# Patient Record
Sex: Female | Born: 1965 | Race: White | Hispanic: No | Marital: Married | Smoking: Never smoker
Health system: Southern US, Community
[De-identification: ages and names within clinical notes are randomized; demographics above are authoritative.]

## PROBLEM LIST (undated history)

## (undated) DIAGNOSIS — R112 Nausea with vomiting, unspecified: Secondary | ICD-10-CM

## (undated) DIAGNOSIS — G43909 Migraine, unspecified, not intractable, without status migrainosus: Secondary | ICD-10-CM

## (undated) DIAGNOSIS — F419 Anxiety disorder, unspecified: Secondary | ICD-10-CM

## (undated) DIAGNOSIS — Z9889 Other specified postprocedural states: Secondary | ICD-10-CM

## (undated) DIAGNOSIS — E119 Type 2 diabetes mellitus without complications: Secondary | ICD-10-CM

## (undated) DIAGNOSIS — I10 Essential (primary) hypertension: Secondary | ICD-10-CM

## (undated) HISTORY — PX: CHOLECYSTECTOMY: SHX55

## (undated) HISTORY — PX: DILATION AND CURETTAGE OF UTERUS: SHX78

---

## 2001-07-25 ENCOUNTER — Ambulatory Visit (HOSPITAL_COMMUNITY): Admission: RE | Admit: 2001-07-25 | Discharge: 2001-07-25 | Payer: Self-pay | Admitting: Obstetrics & Gynecology

## 2001-07-25 ENCOUNTER — Encounter: Payer: Self-pay | Admitting: Obstetrics & Gynecology

## 2001-10-16 ENCOUNTER — Encounter: Payer: Self-pay | Admitting: Obstetrics & Gynecology

## 2001-10-16 ENCOUNTER — Ambulatory Visit (HOSPITAL_COMMUNITY): Admission: RE | Admit: 2001-10-16 | Discharge: 2001-10-16 | Payer: Self-pay | Admitting: Obstetrics & Gynecology

## 2002-01-02 ENCOUNTER — Encounter: Admission: RE | Admit: 2002-01-02 | Discharge: 2002-01-02 | Payer: Self-pay | Admitting: Obstetrics and Gynecology

## 2002-03-07 ENCOUNTER — Ambulatory Visit (HOSPITAL_COMMUNITY): Admission: RE | Admit: 2002-03-07 | Discharge: 2002-03-07 | Payer: Self-pay | Admitting: Obstetrics & Gynecology

## 2002-03-07 ENCOUNTER — Encounter: Payer: Self-pay | Admitting: Obstetrics & Gynecology

## 2002-03-08 ENCOUNTER — Inpatient Hospital Stay (HOSPITAL_COMMUNITY): Admission: AD | Admit: 2002-03-08 | Discharge: 2002-03-11 | Payer: Self-pay | Admitting: Obstetrics & Gynecology

## 2002-03-15 ENCOUNTER — Inpatient Hospital Stay (HOSPITAL_COMMUNITY): Admission: AD | Admit: 2002-03-15 | Discharge: 2002-03-15 | Payer: Self-pay | Admitting: Obstetrics & Gynecology

## 2002-04-03 ENCOUNTER — Other Ambulatory Visit: Admission: RE | Admit: 2002-04-03 | Discharge: 2002-04-03 | Payer: Self-pay | Admitting: Obstetrics & Gynecology

## 2002-10-25 HISTORY — PX: TUBAL LIGATION: SHX77

## 2003-05-28 ENCOUNTER — Other Ambulatory Visit: Admission: RE | Admit: 2003-05-28 | Discharge: 2003-05-28 | Payer: Self-pay | Admitting: Obstetrics & Gynecology

## 2004-03-09 ENCOUNTER — Emergency Department (HOSPITAL_COMMUNITY): Admission: EM | Admit: 2004-03-09 | Discharge: 2004-03-10 | Payer: Self-pay | Admitting: Emergency Medicine

## 2004-04-16 ENCOUNTER — Observation Stay (HOSPITAL_COMMUNITY): Admission: RE | Admit: 2004-04-16 | Discharge: 2004-04-17 | Payer: Self-pay | Admitting: General Surgery

## 2004-07-07 ENCOUNTER — Other Ambulatory Visit: Admission: RE | Admit: 2004-07-07 | Discharge: 2004-07-07 | Payer: Self-pay | Admitting: Obstetrics and Gynecology

## 2006-02-25 ENCOUNTER — Emergency Department (HOSPITAL_COMMUNITY): Admission: EM | Admit: 2006-02-25 | Discharge: 2006-02-25 | Payer: Self-pay | Admitting: *Deleted

## 2006-03-04 ENCOUNTER — Ambulatory Visit: Payer: Self-pay | Admitting: Cardiology

## 2006-03-15 ENCOUNTER — Ambulatory Visit: Payer: Self-pay

## 2011-06-08 ENCOUNTER — Encounter (HOSPITAL_COMMUNITY): Payer: Self-pay | Admitting: *Deleted

## 2011-06-08 NOTE — H&P (Signed)
  45 year old white female who presents with very heavy vaginal bleeding with passage of large blood clots and irregular vaginal bleeding. Initially seen on May 26, 2011 for this problem She was treated with Lysteda 650 mg 2 TID as well as Orthotricyclen birth control pills in an effort to control the heavy flow. This did decrease the bleeding but she presented again on June 08, 2011 with a renewed onset of the heavy flow. Due to the poor response to medical therapy she is being taken to the operating room to undergo D&C hysteroscopy and endometrial ablation.  Allergic history is positive for allergy to Sulfa antibiotics.  ROS:  Respiratory: no SOB or cough GI: no nausea, vomiting or diarrhea. GU: no dysuria, frequency or urgency Gyn: positive for irregular cycles, no discharge or itch  Physical Exam:  Afebrile,  BP  126/80 Pulse 84 Resp 16  HEENT: PERRLA, normocephalic Heart:  Regular rhythm Abdomen: soft, non tender, no enlargement of the liver or kidneys or spleen Pelvic:  Normal external genitalia              BUS: wnl              Vagina: without lesion but many blood clots present   Cervix: without lesion, non tender blood per os               Uterus: normal size, anterior, non tender               Adnexa: without mass non tender  Impression:   Menometrorrhagia without responce to medical Rx               Plan: D&C hysteroscopy, endometrial ablation.            Risk and benefits have been have been discussed.   Miguel Aschoff MD 06/08/2011

## 2011-06-09 ENCOUNTER — Encounter (HOSPITAL_COMMUNITY): Payer: Self-pay | Admitting: Anesthesiology

## 2011-06-09 ENCOUNTER — Ambulatory Visit (HOSPITAL_COMMUNITY): Payer: BC Managed Care – PPO | Admitting: Anesthesiology

## 2011-06-09 ENCOUNTER — Encounter (HOSPITAL_COMMUNITY)
Admission: RE | Disposition: A | Discharge status: Home / Self Care | Payer: Self-pay | Source: Ambulatory Visit | Attending: Obstetrics and Gynecology

## 2011-06-09 ENCOUNTER — Ambulatory Visit (HOSPITAL_COMMUNITY)
Admission: RE | Admit: 2011-06-09 | Discharge: 2011-06-09 | Disposition: A | Discharge status: Home / Self Care | Payer: BC Managed Care – PPO | Source: Ambulatory Visit | Attending: Obstetrics and Gynecology | Admitting: Obstetrics and Gynecology

## 2011-06-09 ENCOUNTER — Other Ambulatory Visit: Payer: Self-pay | Admitting: Obstetrics and Gynecology

## 2011-06-09 DIAGNOSIS — N921 Excessive and frequent menstruation with irregular cycle: Secondary | ICD-10-CM

## 2011-06-09 DIAGNOSIS — N920 Excessive and frequent menstruation with regular cycle: Secondary | ICD-10-CM | POA: Insufficient documentation

## 2011-06-09 DIAGNOSIS — N926 Irregular menstruation, unspecified: Secondary | ICD-10-CM | POA: Insufficient documentation

## 2011-06-09 HISTORY — DX: Anxiety disorder, unspecified: F41.9

## 2011-06-09 HISTORY — DX: Other specified postprocedural states: Z98.890

## 2011-06-09 HISTORY — DX: Nausea with vomiting, unspecified: R11.2

## 2011-06-09 LAB — BASIC METABOLIC PANEL
CO2: 26 mEq/L (ref 19–32)
Chloride: 102 mEq/L (ref 96–112)
Sodium: 138 mEq/L (ref 135–145)

## 2011-06-09 LAB — CBC
Platelets: 218 10*3/uL (ref 150–400)
RBC: 4.54 MIL/uL (ref 3.87–5.11)
WBC: 8.8 10*3/uL (ref 4.0–10.5)

## 2011-06-09 LAB — PROTIME-INR
INR: 0.99 (ref 0.00–1.49)
Prothrombin Time: 13.3 seconds (ref 11.6–15.2)

## 2011-06-09 LAB — PREGNANCY, URINE: Preg Test, Ur: NEGATIVE

## 2011-06-09 SURGERY — DILATATION & CURETTAGE/HYSTEROSCOPY WITH NOVASURE ABLATION
Anesthesia: Monitor Anesthesia Care | Site: Vagina | Wound class: Clean Contaminated

## 2011-06-09 MED ORDER — PROPOFOL 10 MG/ML IV EMUL
INTRAVENOUS | Status: DC | PRN
  Administered 2011-06-09: 200 mg via INTRAVENOUS

## 2011-06-09 MED ORDER — ONDANSETRON HCL 4 MG/2ML IJ SOLN
INTRAMUSCULAR | Status: AC
  Filled 2011-06-09: qty 2

## 2011-06-09 MED ORDER — KETOROLAC TROMETHAMINE 30 MG/ML IJ SOLN
INTRAMUSCULAR | Status: DC | PRN
  Administered 2011-06-09: 30 mg via INTRAVENOUS

## 2011-06-09 MED ORDER — PROPOFOL 10 MG/ML IV EMUL
INTRAVENOUS | Status: AC
  Filled 2011-06-09: qty 20

## 2011-06-09 MED ORDER — METOCLOPRAMIDE HCL 5 MG/ML IJ SOLN
INTRAMUSCULAR | Status: AC
  Administered 2011-06-09: 10 mg via INTRAVENOUS
  Filled 2011-06-09: qty 2

## 2011-06-09 MED ORDER — MIDAZOLAM HCL 2 MG/2ML IJ SOLN
INTRAMUSCULAR | Status: AC
  Filled 2011-06-09: qty 2

## 2011-06-09 MED ORDER — LIDOCAINE HCL (CARDIAC) 20 MG/ML IV SOLN
INTRAVENOUS | Status: AC
  Filled 2011-06-09: qty 5

## 2011-06-09 MED ORDER — MIDAZOLAM HCL 5 MG/5ML IJ SOLN
INTRAMUSCULAR | Status: DC | PRN
  Administered 2011-06-09: 2 mg via INTRAVENOUS

## 2011-06-09 MED ORDER — LACTATED RINGERS IR SOLN
Status: DC | PRN
  Administered 2011-06-09: 3000 mL

## 2011-06-09 MED ORDER — FENTANYL CITRATE 0.05 MG/ML IJ SOLN
25.0000 ug | INTRAMUSCULAR | Status: DC | PRN
  Administered 2011-06-09: 50 ug via INTRAVENOUS

## 2011-06-09 MED ORDER — FENTANYL CITRATE 0.05 MG/ML IJ SOLN
INTRAMUSCULAR | Status: AC
  Administered 2011-06-09: 50 ug via INTRAVENOUS
  Filled 2011-06-09: qty 2

## 2011-06-09 MED ORDER — KETOROLAC TROMETHAMINE 30 MG/ML IJ SOLN
INTRAMUSCULAR | Status: AC
  Filled 2011-06-09: qty 1

## 2011-06-09 MED ORDER — METOCLOPRAMIDE HCL 5 MG/ML IJ SOLN
10.0000 mg | Freq: Once | INTRAMUSCULAR | Status: AC
  Administered 2011-06-09: 10 mg via INTRAVENOUS

## 2011-06-09 MED ORDER — FENTANYL CITRATE 0.05 MG/ML IJ SOLN
INTRAMUSCULAR | Status: DC | PRN
  Administered 2011-06-09: 100 ug via INTRAVENOUS

## 2011-06-09 MED ORDER — CEFAZOLIN SODIUM 1-5 GM-% IV SOLN
INTRAVENOUS | Status: AC
  Administered 2011-06-09: 1 g via INTRAVENOUS
  Filled 2011-06-09: qty 50

## 2011-06-09 MED ORDER — CEFAZOLIN SODIUM 1-5 GM-% IV SOLN: 1.0000 g | INTRAVENOUS | Status: DC

## 2011-06-09 MED ORDER — ONDANSETRON HCL 4 MG/2ML IJ SOLN
INTRAMUSCULAR | Status: DC | PRN
  Administered 2011-06-09: 4 mg via INTRAVENOUS

## 2011-06-09 MED ORDER — LIDOCAINE HCL 1 % IJ SOLN
INTRAMUSCULAR | Status: DC | PRN
  Administered 2011-06-09: 10 mL via INTRADERMAL

## 2011-06-09 MED ORDER — LACTATED RINGERS IV SOLN
INTRAVENOUS | Status: DC
  Administered 2011-06-09 (×2): via INTRAVENOUS

## 2011-06-09 MED ORDER — LIDOCAINE HCL (CARDIAC) 20 MG/ML IV SOLN
INTRAVENOUS | Status: DC | PRN
  Administered 2011-06-09: 60 mg via INTRAVENOUS

## 2011-06-09 MED ORDER — FENTANYL CITRATE 0.05 MG/ML IJ SOLN
INTRAMUSCULAR | Status: AC
  Filled 2011-06-09: qty 2

## 2011-06-09 SURGICAL SUPPLY — 13 items
ABLATOR ENDOMETRIAL BIPOLAR (ABLATOR) ×2 IMPLANT
CATH ROBINSON RED A/P 16FR (CATHETERS) ×2 IMPLANT
CLOTH BEACON ORANGE TIMEOUT ST (SAFETY) ×2 IMPLANT
CONTAINER PREFILL 10% NBF 60ML (FORM) ×4 IMPLANT
GLOVE BIO SURGEON STRL SZ7.5 (GLOVE) ×4 IMPLANT
GOWN PREVENTION PLUS LG XLONG (DISPOSABLE) ×2 IMPLANT
GOWN PREVENTION PLUS XLARGE (GOWN DISPOSABLE) ×2 IMPLANT
NDL SPNL 22GX3.5 QUINCKE BK (NEEDLE) ×1 IMPLANT
NEEDLE SPNL 22GX3.5 QUINCKE BK (NEEDLE) ×2 IMPLANT
PACK HYSTEROSCOPY LF (CUSTOM PROCEDURE TRAY) ×2 IMPLANT
PAD PREP 24X48 CUFFED NSTRL (MISCELLANEOUS) ×2 IMPLANT
TOWEL OR 17X24 6PK STRL BLUE (TOWEL DISPOSABLE) ×4 IMPLANT
WATER STERILE IRR 1000ML POUR (IV SOLUTION) ×2 IMPLANT

## 2011-06-09 NOTE — Anesthesia Preprocedure Evaluation (Addendum)
Anesthesia Evaluation  Name, MR# and DOB Patient awake  General Assessment Comment  Reviewed: Allergy & Precautions, H&P , NPO status , Patient's Chart, lab work & pertinent test results  History of Anesthesia Complications (+) PONV  Airway Mallampati: III TM Distance: >3 FB Neck ROM: Full    Dental No notable dental hx. (+) Teeth Intact   Pulmonary  clear to auscultation  pulmonary exam normalPulmonary Exam Normal breath sounds clear to auscultation none    Cardiovascular Regular Normal    Neuro/Psych Negative Neurological ROS    GI/Hepatic/Renal negative GI ROS, negative Liver ROS, and negative Renal ROS (+)       Endo/Other  Negative Endocrine ROS (+)   Morbid obesity  Abdominal Normal abdominal exam  (+)   Musculoskeletal negative musculoskeletal ROS (+)   Hematology negative hematology ROS (+)   Peds  Reproductive/Obstetrics negative OB ROS    Anesthesia Other Findings            Anesthesia Physical Anesthesia Plan  ASA: III  Anesthesia Plan: MAC and General   Post-op Pain Management:    Induction: Intravenous  Airway Management Planned: LMA  Additional Equipment:   Intra-op Plan:   Post-operative Plan:   Informed Consent: I have reviewed the patients History and Physical, chart, labs and discussed the procedure including the risks, benefits and alternatives for the proposed anesthesia with the patient or authorized representative who has indicated his/her understanding and acceptance.   Dental Advisory Given  Plan Discussed with: Anesthesiologist  Anesthesia Plan Comments:       Anesthesia Quick Evaluation

## 2011-06-09 NOTE — Anesthesia Postprocedure Evaluation (Signed)
  Anesthesia Post-op Note  Patient: Christine Hood  Procedure(s) Performed:  DILATATION & CURETTAGE/HYSTEROSCOPY WITH NOVASURE ABLATION  Patient Location: PACU  Anesthesia Type: General  Level of Consciousness: awake, alert  and oriented  Airway and Oxygen Therapy: Patient Spontanous Breathing  Post-op Pain: mild  Post-op Assessment: Post-op Vital signs reviewed, Patient's Cardiovascular Status Stable, Respiratory Function Stable, Patent Airway, No signs of Nausea or vomiting and Pain level controlled  Post-op Vital Signs: Reviewed and stable  Complications: No apparent anesthesia complications

## 2011-06-09 NOTE — Brief Op Note (Signed)
06/09/2011  11:52 AM  PATIENT:  Christine Hood  45 y.o. female  PRE-OPERATIVE DIAGNOSIS:  menorrhagia  POST-OPERATIVE DIAGNOSIS:  menorrhagia  PROCEDURE:  Procedure(s): DILATATION & CURETTAGE/HYSTEROSCOPY WITH NOVASURE ABLATION  SURGEON:  Surgeon(s): Miguel Aschoff   ANESTHESIA:   general  ESTIMATED BLOOD LOSS: 30 cc's  DRAINS: none   LOCAL MEDICATIONS USED:  LIDOCAINE 10CC  1%   SPECIMEN:  Source of Specimen:  endometrial currettings  DISPOSITION OF SPECIMEN:  PATHOLOGY  COUNTS:  YES  TOURNIQUET:  * No tourniquets in log *  DICTATION #: Dictated   PLAN OF CARE: Patient to be discharged home  PATIENT DISPOSITION:  PACU - hemodynamically stable.   Delay start of Pharmacological VTE agent (>24hrs) due to surgical blood loss or risk of bleeding:  not applicable

## 2011-06-09 NOTE — Transfer of Care (Signed)
Immediate Anesthesia Transfer of Care Note  Patient: Christine Hood  Procedure(s) Performed:  DILATATION & CURETTAGE/HYSTEROSCOPY WITH NOVASURE ABLATION  Patient Location: PACU  Anesthesia Type: General  Level of Consciousness: awake, alert , oriented, patient cooperative and responds to stimulation  Airway & Oxygen Therapy: Patient Spontanous Breathing and Patient connected to nasal cannula oxygen  Post-op Assessment: Report given to PACU RN and Post -op Vital signs reviewed and stable  Post vital signs: Reviewed and stable  Complications: No apparent anesthesia complications

## 2011-06-10 NOTE — Op Note (Signed)
Christine Hood, VOLLMAN NO.:  [id number]  MEDICAL RECORD NO.:  [id number]  LOCATION:  [facility]                          FACILITY:  [facility]  PHYSICIAN:  Miguel Aschoff, M.D.       DATE OF BIRTH:  1966-04-05  DATE OF PROCEDURE:  06/09/2011 DATE OF DISCHARGE:  06/09/2011                              OPERATIVE REPORT   PREOPERATIVE DIAGNOSIS:  Menorrhagia with irregular cycles.  POSTOPERATIVE DIAGNOSIS:  Menorrhagia with irregular cycles.  PROCEDURES: 1. Cervical dilatation. 2. Hysteroscopy. 3. Uterine curettage followed by NovaSure endometrial ablation.  SURGEON:  Miguel Aschoff, MD  ANESTHESIA:  General.  COMPLICATIONS:  None.  JUSTIFICATION:  The patient is a 45 year old white female who has presented with a history of very heavy menses with passage of clots. She presented to the office approximately 2 weeks ago with the onset of very, very heavy bleeding and clots and attempts were made to control her bleeding using Lysteda and oral contraceptives.  In spite of this treatment, the bleeding has continued and she presented on June 07, 2011, with renewed heavy bleeding with large clots being passed with no response to medical therapy.  Because of this, she presents now to undergo D and C, hysteroscopy, to see if an anatomic etiology for the bleeding can be established and endometrial ablation if indicated to try to arrest any future bleeding.  Risks, benefits, and limitations of this procedure were discussed with the patient and informed consent has been obtained.  DESCRIPTION OF PROCEDURE:  The patient was taken to the operating room, placed in the supine position and general anesthesia was administered without difficulty.  She was then placed in the dorsal lithotomy position and prepped and draped in usual sterile fashion.  Once this was done, the bladder was catheterized and examination under anesthesia was carried out which revealed normal external genitalia, normal  Bartholin and Skene glands, normal urethra.  The vaginal vault was without gross lesion.  The cervix was without gross lesion.  Uterus was noted to be top normal size.  There were no adnexal masses noted.  At this point, a speculum was placed into the vaginal vault.  Anterior cervical lip was grasped with tenaculum and then the uterine cavity was sounded to 10 cm. Cervical length at 4 cm was then found for cavity length of 6 cm.  After this was done, the cervix was further dilated to #[facility] dilator could be passed.  When this was done, the diagnostic hysteroscope was then advanced through the endocervical canal.  No endocervical lesions were noted on entering the endometrial cavity other than a large amount of sloughing tissue being noted.  The cavity appeared to be smooth and regular.  There were no polyps or submucous myomas noted.  At this point, the hysteroscope was removed and sharp vigorous curettage was carried out with the tissue being sent for histologic study.  After the curettage was carried out, the NovaSure endometrial ablation unit was introduced into uterine cavity, cavity width of 4.2 cm was then measured.  The cavity assessment test was then carried out and passed and once this was done, treatment cycle at 55 seconds was carried  out successfully using the NovaSure endometrial ablation unit.  On completion of the treatment, the NovaSure unit was removed intact.  The hysteroscope was readvanced into uterine cavity.  The cavity appeared to be well ablated and coagulated.  Once this was done, a tenaculum was removed.  The cervix was then injected with total of 10 mL of 1% Xylocaine for postop analgesia.  The patient was reversed from the anesthetic and taken out of lithotomy position and brought to recovery room in satisfactory condition.  The estimated blood loss was approximately 30-40 mL.  Plan is for the patient be discharged home. Medications for home include Vicodin  1 every 3-4 hours as needed for pain.  The patient is instructed to resume any other preoperative medications except for the medications which were used to treat her heavy bleeding which include Lysteda and oral contraceptives.  The patient will be seen back in 4 weeks in followup examination.  She is to call for any problems such as fever, pain, or heavy bleeding.  She is being sent home in good condition on a regular diet in satisfactory condition.     Miguel Aschoff, M.D.     AR/MEDQ  D:  06/09/2011  T:  06/10/2011  Job:  [id number]

## 2012-09-11 ENCOUNTER — Emergency Department: Payer: Self-pay | Admitting: Emergency Medicine

## 2012-09-11 LAB — COMPREHENSIVE METABOLIC PANEL
Albumin: 3.3 g/dL — ABNORMAL LOW (ref 3.4–5.0)
Alkaline Phosphatase: 83 U/L (ref 50–136)
BUN: 8 mg/dL (ref 7–18)
Bilirubin,Total: 0.4 mg/dL (ref 0.2–1.0)
Co2: 28 mmol/L (ref 21–32)
EGFR (Non-African Amer.): >60
Glucose: 133 mg/dL — ABNORMAL HIGH (ref 65–99)
Osmolality: 274 (ref 275–301)

## 2012-09-11 LAB — URINALYSIS, COMPLETE
Leukocyte Esterase: NEGATIVE
Nitrite: NEGATIVE
Protein: NEGATIVE
RBC,UR: 1 /HPF (ref 0–5)
Specific Gravity: 1.004 (ref 1.003–1.030)
WBC UR: 1 /HPF (ref 0–5)

## 2012-09-11 LAB — CBC
HCT: 42.2 % (ref 35.0–47.0)
Platelet: 219 10*3/uL (ref 150–440)
RBC: 4.78 10*6/uL (ref 3.80–5.20)
RDW: 13 % (ref 11.5–14.5)
WBC: 13.3 10*3/uL — ABNORMAL HIGH (ref 3.6–11.0)

## 2013-03-05 ENCOUNTER — Other Ambulatory Visit: Payer: Self-pay | Admitting: Obstetrics and Gynecology

## 2013-03-05 DIAGNOSIS — R928 Other abnormal and inconclusive findings on diagnostic imaging of breast: Secondary | ICD-10-CM

## 2013-03-16 ENCOUNTER — Ambulatory Visit
Admission: RE | Admit: 2013-03-16 | Discharge: 2013-03-16 | Disposition: A | Discharge status: Home / Self Care | Payer: BC Managed Care – PPO | Source: Ambulatory Visit | Attending: Obstetrics and Gynecology | Admitting: Obstetrics and Gynecology

## 2013-03-16 DIAGNOSIS — R928 Other abnormal and inconclusive findings on diagnostic imaging of breast: Secondary | ICD-10-CM

## 2013-09-19 ENCOUNTER — Other Ambulatory Visit: Payer: Self-pay | Admitting: Obstetrics and Gynecology

## 2013-09-19 DIAGNOSIS — N6310 Unspecified lump in the right breast, unspecified quadrant: Secondary | ICD-10-CM

## 2013-09-26 ENCOUNTER — Ambulatory Visit
Admission: RE | Admit: 2013-09-26 | Discharge: 2013-09-26 | Disposition: A | Discharge status: Home / Self Care | Payer: BC Managed Care – PPO | Source: Ambulatory Visit | Attending: Obstetrics and Gynecology | Admitting: Obstetrics and Gynecology

## 2013-09-26 DIAGNOSIS — N6310 Unspecified lump in the right breast, unspecified quadrant: Secondary | ICD-10-CM

## 2017-03-24 ENCOUNTER — Other Ambulatory Visit: Payer: Self-pay | Admitting: Obstetrics and Gynecology

## 2017-03-24 DIAGNOSIS — N63 Unspecified lump in unspecified breast: Secondary | ICD-10-CM

## 2017-04-14 ENCOUNTER — Other Ambulatory Visit: Payer: Self-pay | Admitting: Obstetrics and Gynecology

## 2017-04-14 DIAGNOSIS — N63 Unspecified lump in unspecified breast: Secondary | ICD-10-CM

## 2017-04-18 ENCOUNTER — Ambulatory Visit
Admission: RE | Admit: 2017-04-18 | Discharge: 2017-04-18 | Disposition: A | Discharge status: Home / Self Care | Payer: BLUE CROSS/BLUE SHIELD | Source: Ambulatory Visit | Attending: Obstetrics and Gynecology | Admitting: Obstetrics and Gynecology

## 2017-04-18 DIAGNOSIS — N63 Unspecified lump in unspecified breast: Secondary | ICD-10-CM

## 2019-03-07 DIAGNOSIS — N3090 Cystitis, unspecified without hematuria: Secondary | ICD-10-CM | POA: Diagnosis not present

## 2019-03-07 DIAGNOSIS — N3001 Acute cystitis with hematuria: Secondary | ICD-10-CM | POA: Diagnosis not present

## 2019-03-07 DIAGNOSIS — J069 Acute upper respiratory infection, unspecified: Secondary | ICD-10-CM | POA: Diagnosis not present

## 2021-04-06 ENCOUNTER — Encounter (HOSPITAL_BASED_OUTPATIENT_CLINIC_OR_DEPARTMENT_OTHER): Payer: Self-pay | Admitting: *Deleted

## 2021-04-06 ENCOUNTER — Emergency Department (HOSPITAL_BASED_OUTPATIENT_CLINIC_OR_DEPARTMENT_OTHER): Payer: BC Managed Care – PPO

## 2021-04-06 ENCOUNTER — Other Ambulatory Visit: Payer: Self-pay

## 2021-04-06 ENCOUNTER — Emergency Department (HOSPITAL_BASED_OUTPATIENT_CLINIC_OR_DEPARTMENT_OTHER)
Admission: EM | Admit: 2021-04-06 | Discharge: 2021-04-06 | Disposition: A | Discharge status: Home / Self Care | Payer: BC Managed Care – PPO | Attending: Emergency Medicine | Admitting: Emergency Medicine

## 2021-04-06 DIAGNOSIS — R739 Hyperglycemia, unspecified: Secondary | ICD-10-CM | POA: Insufficient documentation

## 2021-04-06 DIAGNOSIS — R519 Headache, unspecified: Secondary | ICD-10-CM | POA: Insufficient documentation

## 2021-04-06 LAB — COMPREHENSIVE METABOLIC PANEL
ALT: 15 U/L (ref 0–44)
AST: 12 U/L — ABNORMAL LOW (ref 15–41)
Albumin: 3.7 g/dL (ref 3.5–5.0)
Alkaline Phosphatase: 100 U/L (ref 38–126)
Anion gap: 9 (ref 5–15)
BUN: 12 mg/dL (ref 6–20)
CO2: 26 mmol/L (ref 22–32)
Calcium: 8.4 mg/dL — ABNORMAL LOW (ref 8.9–10.3)
Chloride: 100 mmol/L (ref 98–111)
Creatinine, Ser: 0.71 mg/dL (ref 0.44–1.00)
GFR, Estimated: >60 mL/min (ref >60)
Glucose, Bld: 280 mg/dL — ABNORMAL HIGH (ref 70–99)
Potassium: 3.7 mmol/L (ref 3.5–5.1)
Sodium: 135 mmol/L (ref 135–145)
Total Bilirubin: 0.4 mg/dL (ref 0.3–1.2)
Total Protein: 6.4 g/dL — ABNORMAL LOW (ref 6.5–8.1)

## 2021-04-06 LAB — SEDIMENTATION RATE: Sed Rate: 18 mm/hr (ref 0–22)

## 2021-04-06 LAB — CBC WITH DIFFERENTIAL/PLATELET
Abs Immature Granulocytes: 0.04 10*3/uL (ref 0.00–0.07)
Basophils Absolute: 0 10*3/uL (ref 0.0–0.1)
Basophils Relative: 1 %
Eosinophils Absolute: 0.4 10*3/uL (ref 0.0–0.5)
Eosinophils Relative: 6 %
HCT: 41.9 % (ref 36.0–46.0)
Hemoglobin: 14 g/dL (ref 12.0–15.0)
Immature Granulocytes: 1 %
Lymphocytes Relative: 29 %
Lymphs Abs: 2.2 10*3/uL (ref 0.7–4.0)
MCH: 29.5 pg (ref 26.0–34.0)
MCHC: 33.4 g/dL (ref 30.0–36.0)
MCV: 88.4 fL (ref 80.0–100.0)
Monocytes Absolute: 0.6 10*3/uL (ref 0.1–1.0)
Monocytes Relative: 7 %
Neutro Abs: 4.3 10*3/uL (ref 1.7–7.7)
Neutrophils Relative %: 56 %
Platelets: 201 10*3/uL (ref 150–400)
RBC: 4.74 MIL/uL (ref 3.87–5.11)
RDW: 12.2 % (ref 11.5–15.5)
WBC: 7.5 10*3/uL (ref 4.0–10.5)
nRBC: 0 % (ref 0.0–0.2)

## 2021-04-06 MED ORDER — METOCLOPRAMIDE HCL 5 MG/ML IJ SOLN
10.0000 mg | Freq: Once | INTRAMUSCULAR | Status: AC
  Administered 2021-04-06: 10 mg via INTRAVENOUS
  Filled 2021-04-06: qty 2

## 2021-04-06 MED ORDER — IOHEXOL 350 MG/ML SOLN
100.0000 mL | Freq: Once | INTRAVENOUS | Status: AC | PRN
  Administered 2021-04-06: 100 mL via INTRAVENOUS

## 2021-04-06 MED ORDER — DIPHENHYDRAMINE HCL 50 MG/ML IJ SOLN
25.0000 mg | Freq: Once | INTRAMUSCULAR | Status: AC
  Administered 2021-04-06: 25 mg via INTRAVENOUS
  Filled 2021-04-06: qty 1

## 2021-04-06 MED ORDER — METOCLOPRAMIDE HCL 10 MG PO TABS: ORAL_TABLET | ORAL | 0 refills | Status: AC

## 2021-04-06 MED ORDER — DIPHENHYDRAMINE HCL 25 MG PO TABS: 25.0000 mg | ORAL_TABLET | Freq: Four times a day (QID) | ORAL | 0 refills | Status: AC | PRN

## 2021-04-06 NOTE — Discharge Instructions (Addendum)
1.  Schedule recheck with your doctor within the next 3 to 5 days.  You need to address new onset diabetes. 2.  You may take Reglan and Benadryl for a headache.  You may combine this with either Tylenol or ibuprofen. 3.  You may need referral to a neurologist for your headaches if they persist. 4.  Return to the emergency department if you get other concerning symptoms with your headache such as visual problems, weakness numbness tingling or other concerning symptoms.

## 2021-04-06 NOTE — ED Provider Notes (Signed)
[facility] EMERGENCY DEPT Provider Note   CSN: [id number] Arrival date & time: 04/06/21  1512     History Chief Complaint  Patient presents with   Headache    Christine Hood is a 55 y.o. female.  HPI Patient reports has been getting headache off and on for about a month.  She has no headache history.  She reports that the severe headache that comes from the back of her neck on the left up behind her ear and towards her temple and the top of the head.  She reports when it comes on is an intense aching quality.  It would last for hours.  Sometimes she has had pain for days.  No nausea or vomiting.  No neurodeficits associated.  Patient denies any change in vision.  She was seen and diagnosed with suspected cervical muscle spasm.  She was given some pain medications to take.  She reports they do help but then they wear off and the headache persists.    Past Medical History:  Diagnosis Date   Anxiety    PONV (postoperative nausea and vomiting)     There are no problems to display for this patient.   Past Surgical History:  Procedure Laterality Date   CHOLECYSTECTOMY     2004   DILATION AND CURETTAGE OF UTERUS     2002-missed ab   TUBAL LIGATION  2004     OB History   No obstetric history on file.     No family history on file.  Social History   Tobacco Use   Smoking status: Never   Smokeless tobacco: Never  Substance Use Topics   Alcohol use: No   Drug use: No    Home Medications Prior to Admission medications   Medication Sig Start Date End Date Taking? Authorizing Provider  diphenhydrAMINE (BENADRYL) 25 MG tablet Take 1 tablet (25 mg total) by mouth every 6 (six) hours as needed. 04/06/21  Yes Arby Barrette, MD  metoCLOPramide (REGLAN) 10 MG tablet You may take 1 tablet every 6 hours with 25 mg of Benadryl for headache. 04/06/21  Yes Arby Barrette, MD  acetaminophen (TYLENOL) 500 MG tablet Take 1,000 mg by mouth 2 (two) times daily as needed.  For cramping      [provider]  cholestyramine (QUESTRAN) 4 G packet Take 1 packet by mouth daily before breakfast.      [provider]  tranexamic acid (LYSTEDA) 650 MG TABS Take 1,300 mg by mouth 3 (three) times daily.      [provider]    Allergies    Sulfa antibiotics  Review of Systems   Review of Systems 10 systems reviewed and negative except as per HPI Physical Exam Updated Vital Signs BP 111/74   Pulse 84   Temp 99.2 F (37.3 C)   Resp 16   Ht 5\' 2"  (1.575 m)   Wt 114.3 kg   SpO2 93%   BMI 46.09 kg/m   Physical Exam Constitutional:      Appearance: Normal appearance.  HENT:     Head: Normocephalic and atraumatic.     Right Ear: Tympanic membrane normal.     Left Ear: Tympanic membrane normal.     Mouth/Throat:     Mouth: Mucous membranes are moist.     Pharynx: Oropharynx is clear.  Eyes:     Extraocular Movements: Extraocular movements intact.     Pupils: Pupils are equal, round, and reactive to light.  Neck:  Comments: Patient does have some reproducible pain in the paracervical muscle body and around the mastoid.  No appreciable abnormality. Cardiovascular:     Rate and Rhythm: Normal rate and regular rhythm.  Pulmonary:     Effort: Pulmonary effort is normal.     Breath sounds: Normal breath sounds.  Abdominal:     General: There is no distension.     Palpations: Abdomen is soft.     Tenderness: There is no abdominal tenderness.  Musculoskeletal:        General: No swelling or tenderness. Normal range of motion.     Cervical back: Neck supple.     Right lower leg: No edema.     Left lower leg: No edema.  Skin:    General: Skin is warm and dry.  Neurological:     General: No focal deficit present.     Mental Status: She is alert and oriented to person, place, and time.     Cranial Nerves: No cranial nerve deficit.     Sensory: No sensory deficit.     Motor: No weakness.     Coordination: Coordination  normal.  Psychiatric:        Mood and Affect: Mood normal.    ED Results / Procedures / Treatments   Labs (all labs ordered are listed, but only abnormal results are displayed) Labs Reviewed  COMPREHENSIVE METABOLIC PANEL - Abnormal; Notable for the following components:      Result Value   Glucose, Bld 280 (*)    Calcium 8.4 (*)    Total Protein 6.4 (*)    AST 12 (*)    All other components within normal limits  CBC WITH DIFFERENTIAL/PLATELET  SEDIMENTATION RATE    EKG None  Radiology CT Angio Head W or Wo Contrast  Result Date: 04/06/2021 CLINICAL DATA:  Persistent headache. EXAM: CT ANGIOGRAPHY HEAD AND NECK TECHNIQUE: Multidetector CT imaging of the head and neck was performed using the standard protocol during bolus administration of intravenous contrast. Multiplanar CT image reconstructions and MIPs were obtained to evaluate the vascular anatomy. Carotid stenosis measurements (when applicable) are obtained utilizing NASCET criteria, using the distal internal carotid diameter as the denominator. CONTRAST:  100mL OMNIPAQUE IOHEXOL 350 MG/ML SOLN COMPARISON:  None. FINDINGS: CT HEAD FINDINGS Brain: There is no mass, hemorrhage or extra-axial collection. The size and configuration of the ventricles and extra-axial CSF spaces are normal. There is no acute or chronic infarction. The brain parenchyma is normal. Skull: The visualized skull base, calvarium and extracranial soft tissues are normal. Sinuses/Orbits: No fluid levels or advanced mucosal thickening of the visualized paranasal sinuses. No mastoid or middle ear effusion. The orbits are normal. CTA NECK FINDINGS SKELETON: There is no bony spinal canal stenosis. No lytic or blastic lesion. OTHER NECK: Normal pharynx, larynx and major salivary glands. No cervical lymphadenopathy. 2.5 cm nodule in the right lobe of the thyroid gland. UPPER CHEST: No pneumothorax or pleural effusion. No nodules or masses. AORTIC ARCH: There is no calcific  atherosclerosis of the aortic arch. There is no aneurysm, dissection or hemodynamically significant stenosis of the visualized portion of the aorta. Conventional 3 vessel aortic branching pattern. The visualized proximal subclavian arteries are widely patent. RIGHT CAROTID SYSTEM: Normal without aneurysm, dissection or stenosis. LEFT CAROTID SYSTEM: Normal without aneurysm, dissection or stenosis. VERTEBRAL ARTERIES: Left dominant configuration. Both origins are clearly patent. There is no dissection, occlusion or flow-limiting stenosis to the skull base (V1-V3 segments). CTA HEAD FINDINGS POSTERIOR CIRCULATION: --  Vertebral arteries: Normal V4 segments. --Inferior cerebellar arteries: Normal. --Basilar artery: Normal. --Superior cerebellar arteries: Normal. --Posterior cerebral arteries (PCA): Normal. ANTERIOR CIRCULATION: --Intracranial internal carotid arteries: Normal. --Anterior cerebral arteries (ACA): Normal. Both A1 segments are present. Patent anterior communicating artery (a-comm). --Middle cerebral arteries (MCA): Normal. VENOUS SINUSES: As permitted by contrast timing, patent. ANATOMIC VARIANTS: None Review of the MIP images confirms the above findings. IMPRESSION: 1. Normal CTA of the head and neck. 2. 2.5 cm right thyroid nodule. Recommend thyroid US (ref: J Am Coll Radiol. 2015 Feb;12(2): 143-50). Electronically Signed   By: Deatra Robinson M.D.   On: 04/06/2021 19:32   CT Angio Neck W and/or Wo Contrast  Result Date: 04/06/2021 CLINICAL DATA:  Persistent headache. EXAM: CT ANGIOGRAPHY HEAD AND NECK TECHNIQUE: Multidetector CT imaging of the head and neck was performed using the standard protocol during bolus administration of intravenous contrast. Multiplanar CT image reconstructions and MIPs were obtained to evaluate the vascular anatomy. Carotid stenosis measurements (when applicable) are obtained utilizing NASCET criteria, using the distal internal carotid diameter as the denominator. CONTRAST:   OMNIPAQUE IOHEXOL 350 MG/ML SOLN COMPARISON:  None. FINDINGS: CT HEAD FINDINGS Brain: There is no mass, hemorrhage or extra-axial collection. The size and configuration of the ventricles and extra-axial CSF spaces are normal. There is no acute or chronic infarction. The brain parenchyma is normal. Skull: The visualized skull base, calvarium and extracranial soft tissues are normal. Sinuses/Orbits: No fluid levels or advanced mucosal thickening of the visualized paranasal sinuses. No mastoid or middle ear effusion. The orbits are normal. CTA NECK FINDINGS SKELETON: There is no bony spinal canal stenosis. No lytic or blastic lesion. OTHER NECK: Normal pharynx, larynx and major salivary glands. No cervical lymphadenopathy. 2.5 cm nodule in the right lobe of the thyroid gland. UPPER CHEST: No pneumothorax or pleural effusion. No nodules or masses. AORTIC ARCH: There is no calcific atherosclerosis of the aortic arch. There is no aneurysm, dissection or hemodynamically significant stenosis of the visualized portion of the aorta. Conventional 3 vessel aortic branching pattern. The visualized proximal subclavian arteries are widely patent. RIGHT CAROTID SYSTEM: Normal without aneurysm, dissection or stenosis. LEFT CAROTID SYSTEM: Normal without aneurysm, dissection or stenosis. VERTEBRAL ARTERIES: Left dominant configuration. Both origins are clearly patent. There is no dissection, occlusion or flow-limiting stenosis to the skull base (V1-V3 segments). CTA HEAD FINDINGS POSTERIOR CIRCULATION: --Vertebral arteries: Normal V4 segments. --Inferior cerebellar arteries: Normal. --Basilar artery: Normal. --Superior cerebellar arteries: Normal. --Posterior cerebral arteries (PCA): Normal. ANTERIOR CIRCULATION: --Intracranial internal carotid arteries: Normal. --Anterior cerebral arteries (ACA): Normal. Both A1 segments are present. Patent anterior communicating artery (a-comm). --Middle cerebral arteries (MCA): Normal.  VENOUS SINUSES: As permitted by contrast timing, patent. ANATOMIC VARIANTS: None Review of the MIP images confirms the above findings. IMPRESSION: 1. Normal CTA of the head and neck. 2. 2.5 cm right thyroid nodule. Recommend thyroid US (ref: J Am Coll Radiol. 2015 Feb;12(2): 143-50). Electronically Signed   By: Deatra Robinson M.D.   On: 04/06/2021 19:32    Procedures Procedures   Medications Ordered in ED Medications  metoCLOPramide (REGLAN) injection 10 mg (10 mg Intravenous Given 04/06/21 1846)  diphenhydrAMINE (BENADRYL) injection 25 mg (25 mg Intravenous Given 04/06/21 1846)  iohexol (OMNIPAQUE) 350 MG/ML injection 100 mL (100 mLs Intravenous Contrast Given 04/06/21 1900)    ED Course  I have reviewed the triage vital signs and the nursing notes.  Pertinent labs & imaging results that were available during my care of the patient  were reviewed by me and considered in my medical decision making (see chart for details).    MDM Rules/Calculators/A&P                          Patient has CT negative for aneurysm or dissection.  Sed rate is within normal limits.  Patient does not have visual disturbance.  No focal motor deficits.  At this time plan will be for close follow-up with PCP.  Patient does have elevated blood sugar but no signs of other metabolic derangement.  Issue is aware of possible prediabetes and this is been monitored but patient advised she is diabetic and needs to start treatment with her PCP.  Headache may be in association with hyperglycemia however other causes possible although at this time stable in terms of emergent headache cause such as meningitis, aneurysm, subarachnoid hemorrhage.  Patient did get relief with Benadryl and Reglan.  Follow-up plan discussed and return precautions included in discharge instructions. Final Clinical Impression(s) / ED Diagnoses Final diagnoses:  Bad headache  Hyperglycemia    Rx / DC Orders ED Discharge Orders          Ordered     metoCLOPramide (REGLAN) 10 MG tablet        04/06/21 2032    diphenhydrAMINE (BENADRYL) 25 MG tablet  Every 6 hours PRN        04/06/21 2032             Arby Barrette, MD 04/06/21 2036

## 2021-04-06 NOTE — ED Triage Notes (Signed)
Pt states she has had a headache for a couple of months, resolved then returned yesterday. No visual changes, No N/V or other symptoms

## 2022-06-28 ENCOUNTER — Ambulatory Visit
Admission: EM | Admit: 2022-06-28 | Discharge: 2022-06-28 | Disposition: A | Discharge status: Home / Self Care | Payer: No Typology Code available for payment source | Attending: Urgent Care | Admitting: Urgent Care

## 2022-06-28 ENCOUNTER — Encounter: Payer: Self-pay | Admitting: Emergency Medicine

## 2022-06-28 DIAGNOSIS — R142 Eructation: Secondary | ICD-10-CM

## 2022-06-28 DIAGNOSIS — R140 Abdominal distension (gaseous): Secondary | ICD-10-CM

## 2022-06-28 DIAGNOSIS — K219 Gastro-esophageal reflux disease without esophagitis: Secondary | ICD-10-CM

## 2022-06-28 MED ORDER — ESOMEPRAZOLE MAGNESIUM 40 MG PO CPDR: 40.0000 mg | DELAYED_RELEASE_CAPSULE | Freq: Every day | ORAL | 0 refills | Status: AC

## 2022-06-28 MED ORDER — FAMOTIDINE 20 MG PO TABS: 20.0000 mg | ORAL_TABLET | Freq: Two times a day (BID) | ORAL | 0 refills | Status: AC

## 2022-06-28 NOTE — ED Provider Notes (Signed)
[facility]  Note:  This document was prepared using Dragon voice recognition software and may include unintentional dictation errors.  MRN: [id number] DOB: [birth year]  Subjective:   Christine Hood is a 56 y.o. female presenting for 1 month history of recurrent persistent and worsening acid reflux.  Patient has been burping a lot.  Feels a lot of abdominal bloating.  Has also felt gassy.  Has been using omeprazole 20 mg which worked previously but is not helping now.  Patient has a history of cholecystectomy and colon issues.  She does have a gastroenterologist but has not followed up with them.  No chest pain, shortness of breath, left arm pain, neck pain, jaw pain, diaphoresis.  No history of heart disease.  Patient is not a smoker.  No hypertension, diabetes, dyslipidemia.  No alcohol use.  No drug use.  No current facility-administered medications for this encounter.  Current Outpatient Medications:    acetaminophen (TYLENOL) 500 MG tablet, Take 1,000 mg by mouth 2 (two) times daily as needed. For cramping  , Disp: , Rfl:    cholestyramine (QUESTRAN) 4 G packet, Take 1 packet by mouth daily before breakfast.  , Disp: , Rfl:    diphenhydrAMINE (BENADRYL) 25 MG tablet, Take 1 tablet (25 mg total) by mouth every 6 (six) hours as needed., Disp: 30 tablet, Rfl: 0   metoCLOPramide (REGLAN) 10 MG tablet, You may take 1 tablet every 6 hours with 25 mg of Benadryl for headache., Disp: 30 tablet, Rfl: 0   tranexamic acid (LYSTEDA) 650 MG TABS, Take 1,300 mg by mouth 3 (three) times daily.  , Disp: , Rfl:    Allergies  Allergen Reactions   Sulfa Antibiotics Nausea Only    Past Medical History:  Diagnosis Date   Anxiety    PONV (postoperative nausea and vomiting)      Past Surgical History:  Procedure Laterality Date   CHOLECYSTECTOMY     2004   DILATION AND CURETTAGE OF UTERUS     2002-missed ab   TUBAL LIGATION  2004    History reviewed. No pertinent family  history.  Social History   Tobacco Use   Smoking status: Never   Smokeless tobacco: Never  Substance Use Topics   Alcohol use: No   Drug use: No    ROS   Objective:   Vitals: BP (!) 144/94   Pulse 99   Temp 98.5 F (36.9 C)   Resp 17   SpO2 96%   Physical Exam Constitutional:      General: She is not in acute distress.    Appearance: Normal appearance. She is well-developed. She is not ill-appearing, toxic-appearing or diaphoretic.  HENT:     Head: Normocephalic and atraumatic.     Nose: Nose normal.     Mouth/Throat:     Mouth: Mucous membranes are moist.     Pharynx: Oropharynx is clear.  Eyes:     General: No scleral icterus.       Right eye: No discharge.        Left eye: No discharge.     Extraocular Movements: Extraocular movements intact.     Conjunctiva/sclera: Conjunctivae normal.  Cardiovascular:     Rate and Rhythm: Normal rate and regular rhythm.     Heart sounds: Normal heart sounds. No murmur heard.    No friction rub. No gallop.  Pulmonary:     Effort: Pulmonary effort is normal. No respiratory distress.     Breath sounds:  No stridor. No wheezing, rhonchi or rales.  Chest:     Chest wall: No tenderness.  Abdominal:     General: Bowel sounds are normal. There is no distension.     Palpations: Abdomen is soft. There is no mass.     Tenderness: There is abdominal tenderness in the epigastric area and periumbilical area. There is no right CVA tenderness, left CVA tenderness, guarding or rebound.  Skin:    General: Skin is warm and dry.  Neurological:     General: No focal deficit present.     Mental Status: She is alert and oriented to person, place, and time.  Psychiatric:        Mood and Affect: Mood normal.        Behavior: Behavior normal.        Thought Content: Thought content normal.        Judgment: Judgment normal.     Assessment and Plan :   PDMP not reviewed this encounter.  1. Gastroesophageal reflux disease, unspecified  whether esophagitis present   2. Burping   3. Gassiness   4. Abdominal bloating    Low suspicion for ACS.  No signs of an acute abdomen.  Recommended managing for acid reflux with Nexium and famotidine.  Follow-up closely with gastroenterology. Counseled patient on potential for adverse effects with medications prescribed/recommended today, ER and return-to-clinic precautions discussed, patient verbalized understanding.    Wallis Bamberg, PA-C 06/28/22 1148

## 2022-06-28 NOTE — ED Triage Notes (Signed)
Pt is present today with c/o heartburn. Pt state that her sx started one month ago. Pt states that she takes omeprazole at night but no relief

## 2022-09-08 IMAGING — CT CT ANGIO NECK
2 of 12 series · 6 of 35 positions shown · IV contrast (omnipaque)
Comparison: None.

CLINICAL DATA: Persistent headache.

EXAM:
CT ANGIOGRAPHY HEAD AND NECK
TECHNIQUE: Multidetector CT imaging of the head and neck was performed using
the standard protocol during bolus administration of intravenous
contrast. Multiplanar CT image reconstructions and MIPs were
obtained to evaluate the vascular anatomy. Carotid stenosis
measurements (when applicable) are obtained utilizing NASCET
criteria, using the distal internal carotid diameter as the
denominator.
CONTRAST:  100mL OMNIPAQUE IOHEXOL 350 MG/ML SOLN

[Series 10: cta head & neck · axial · 0.48mm/px · z∈[-417,-224]mm · 4 of 656 slices shown]
[im 132/656  soft-tissue]
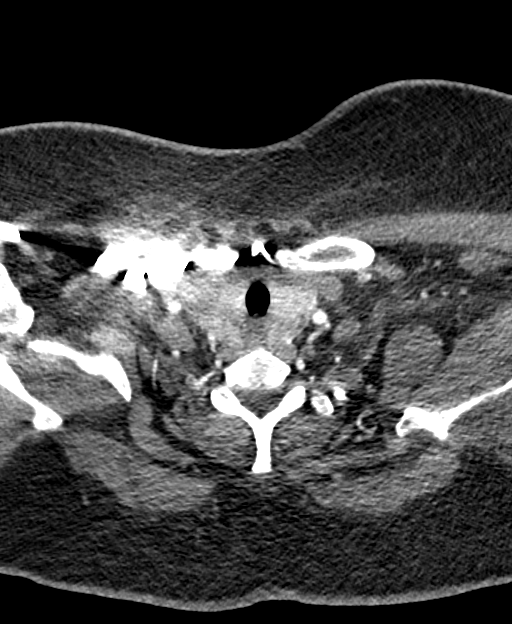
[im 263/656  bone]
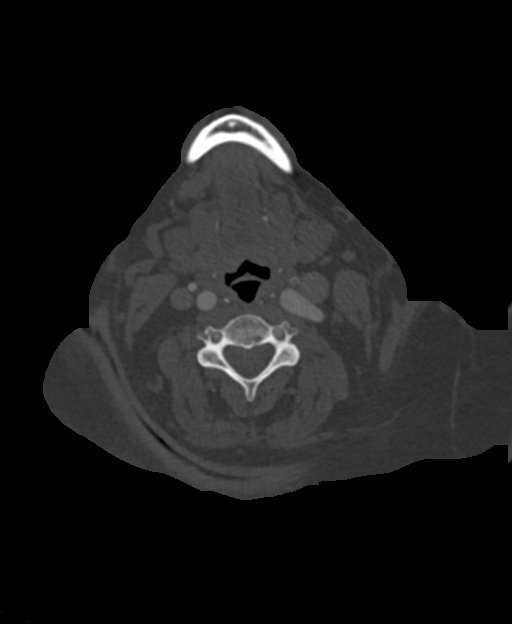
[im 394/656  soft-tissue]
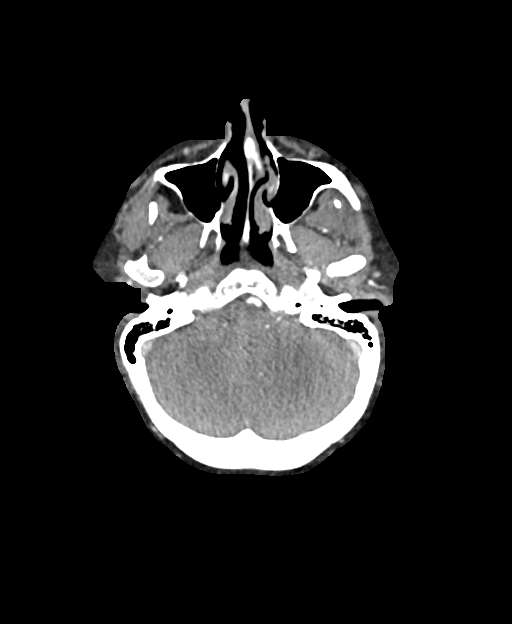
[im 525/656  bone]
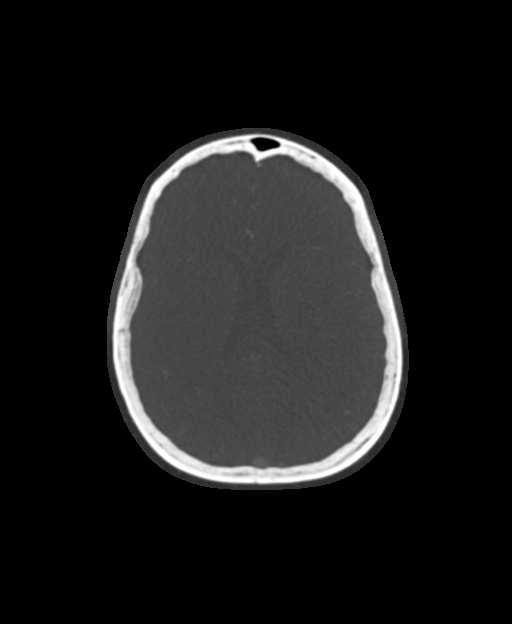

[Series 11: ax thin · axial · 0.48mm/px · z∈[-374,-266]mm · 2 of 328 slices shown]
[im 110/328  soft-tissue]
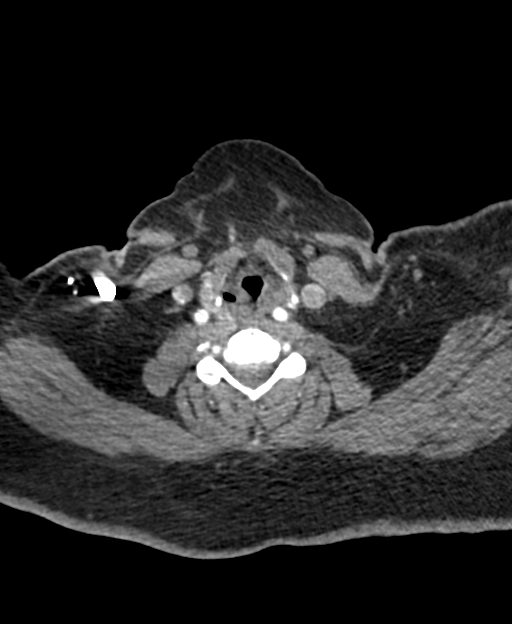
[im 219/328  soft-tissue]
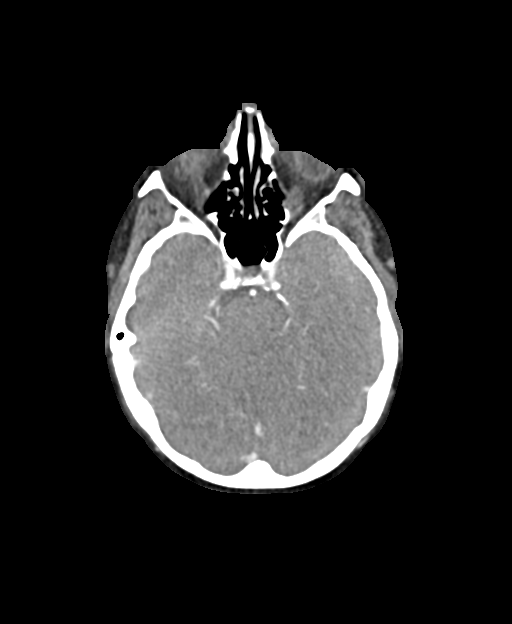

[6 of 35 positions shown; findings below may reference images not displayed]

FINDINGS: CT HEAD FINDINGS

Brain: There is no mass, hemorrhage or extra-axial collection. The
size and configuration of the ventricles and extra-axial CSF spaces
are normal. There is no acute or chronic infarction. The brain
parenchyma is normal.

Skull: The visualized skull base, calvarium and extracranial soft
tissues are normal.

Sinuses/Orbits: No fluid levels or advanced mucosal thickening of
the visualized paranasal sinuses. No mastoid or middle ear effusion.
The orbits are normal.

CTA NECK FINDINGS

SKELETON: There is no bony spinal canal stenosis. No lytic or
blastic lesion.

OTHER NECK: Normal pharynx, larynx and major salivary glands. No
cervical lymphadenopathy. 2.5 cm nodule in the right lobe of the
thyroid gland.

UPPER CHEST: No pneumothorax or pleural effusion. No nodules or
masses.

AORTIC ARCH:

There is no calcific atherosclerosis of the aortic arch. There is no
aneurysm, dissection or hemodynamically significant stenosis of the
visualized portion of the aorta. Conventional 3 vessel aortic
branching pattern. The visualized proximal subclavian arteries are
widely patent.

RIGHT CAROTID SYSTEM: Normal without aneurysm, dissection or
stenosis.

LEFT CAROTID SYSTEM: Normal without aneurysm, dissection or
stenosis.

VERTEBRAL ARTERIES: Left dominant configuration. Both origins are
clearly patent. There is no dissection, occlusion or flow-limiting
stenosis to the skull base (V1-V3 segments).

CTA HEAD FINDINGS

POSTERIOR CIRCULATION:

--Vertebral arteries: Normal V4 segments.

--Inferior cerebellar arteries: Normal.

--Basilar artery: Normal.

--Superior cerebellar arteries: Normal.

--Posterior cerebral arteries (PCA): Normal.

ANTERIOR CIRCULATION:

--Intracranial internal carotid arteries: Normal.

--Anterior cerebral arteries (ACA): Normal. Both A1 segments are
present. Patent anterior communicating artery (a-comm).

--Middle cerebral arteries (MCA): Normal.

VENOUS SINUSES: As permitted by contrast timing, patent.

ANATOMIC VARIANTS: None

Review of the MIP images confirms the above findings.
IMPRESSION: 1. Normal CTA of the head and neck.
2. 2.5 cm right thyroid nodule. Recommend thyroid US (ref: [HOSPITAL]. [DATE]): 143-50).

## 2024-01-16 ENCOUNTER — Other Ambulatory Visit: Payer: Self-pay

## 2024-01-16 ENCOUNTER — Encounter (HOSPITAL_BASED_OUTPATIENT_CLINIC_OR_DEPARTMENT_OTHER): Payer: Self-pay | Admitting: Emergency Medicine

## 2024-01-16 ENCOUNTER — Emergency Department (HOSPITAL_BASED_OUTPATIENT_CLINIC_OR_DEPARTMENT_OTHER)
Admission: EM | Admit: 2024-01-16 | Discharge: 2024-01-16 | Disposition: A | Discharge status: Home / Self Care | Attending: Emergency Medicine | Admitting: Emergency Medicine

## 2024-01-16 DIAGNOSIS — N3000 Acute cystitis without hematuria: Secondary | ICD-10-CM | POA: Insufficient documentation

## 2024-01-16 DIAGNOSIS — R531 Weakness: Secondary | ICD-10-CM | POA: Diagnosis present

## 2024-01-16 HISTORY — DX: Type 2 diabetes mellitus without complications: E11.9

## 2024-01-16 HISTORY — DX: Migraine, unspecified, not intractable, without status migrainosus: G43.909

## 2024-01-16 HISTORY — DX: Essential (primary) hypertension: I10

## 2024-01-16 LAB — CBC WITH DIFFERENTIAL/PLATELET
Abs Immature Granulocytes: 0.02 10*3/uL (ref 0.00–0.07)
Basophils Absolute: 0 10*3/uL (ref 0.0–0.1)
Basophils Relative: 1 %
Eosinophils Absolute: 0.3 10*3/uL (ref 0.0–0.5)
Eosinophils Relative: 4 %
HCT: 48.7 % — ABNORMAL HIGH (ref 36.0–46.0)
Hemoglobin: 16.2 g/dL — ABNORMAL HIGH (ref 12.0–15.0)
Immature Granulocytes: 0 %
Lymphocytes Relative: 22 %
Lymphs Abs: 1.9 10*3/uL (ref 0.7–4.0)
MCH: 29.3 pg (ref 26.0–34.0)
MCHC: 33.3 g/dL (ref 30.0–36.0)
MCV: 88.2 fL (ref 80.0–100.0)
Monocytes Absolute: 0.5 10*3/uL (ref 0.1–1.0)
Monocytes Relative: 6 %
Neutro Abs: 5.7 10*3/uL (ref 1.7–7.7)
Neutrophils Relative %: 67 %
Platelets: 245 10*3/uL (ref 150–400)
RBC: 5.52 MIL/uL — ABNORMAL HIGH (ref 3.87–5.11)
RDW: 12.5 % (ref 11.5–15.5)
WBC: 8.5 10*3/uL (ref 4.0–10.5)
nRBC: 0 % (ref 0.0–0.2)

## 2024-01-16 LAB — COMPREHENSIVE METABOLIC PANEL
ALT: 17 U/L (ref 0–44)
AST: 14 U/L — ABNORMAL LOW (ref 15–41)
Albumin: 4.2 g/dL (ref 3.5–5.0)
Alkaline Phosphatase: 78 U/L (ref 38–126)
Anion gap: 11 (ref 5–15)
BUN: 9 mg/dL (ref 6–20)
CO2: 28 mmol/L (ref 22–32)
Calcium: 9 mg/dL (ref 8.9–10.3)
Chloride: 102 mmol/L (ref 98–111)
Creatinine, Ser: 0.71 mg/dL (ref 0.44–1.00)
GFR, Estimated: >60 mL/min (ref >60)
Glucose, Bld: 146 mg/dL — ABNORMAL HIGH (ref 70–99)
Potassium: 3.5 mmol/L (ref 3.5–5.1)
Sodium: 141 mmol/L (ref 135–145)
Total Bilirubin: 0.5 mg/dL (ref 0.0–1.2)
Total Protein: 7 g/dL (ref 6.5–8.1)

## 2024-01-16 LAB — URINALYSIS, ROUTINE W REFLEX MICROSCOPIC
Bilirubin Urine: NEGATIVE
Glucose, UA: >1000 mg/dL — AB
Hgb urine dipstick: NEGATIVE
Ketones, ur: NEGATIVE mg/dL
Leukocytes,Ua: NEGATIVE
Nitrite: POSITIVE — AB
Protein, ur: NEGATIVE mg/dL
Specific Gravity, Urine: 1.026 (ref 1.005–1.030)
pH: 5.5 (ref 5.0–8.0)

## 2024-01-16 MED ORDER — CEPHALEXIN 250 MG PO CAPS
500.0000 mg | ORAL_CAPSULE | Freq: Once | ORAL | Status: AC
  Administered 2024-01-16: 500 mg via ORAL
  Filled 2024-01-16: qty 2

## 2024-01-16 MED ORDER — SODIUM CHLORIDE 0.9 % IV BOLUS
1000.0000 mL | Freq: Once | INTRAVENOUS | Status: AC
  Administered 2024-01-16: 1000 mL via INTRAVENOUS

## 2024-01-16 MED ORDER — CEPHALEXIN 500 MG PO CAPS: 500.0000 mg | ORAL_CAPSULE | Freq: Four times a day (QID) | ORAL | 0 refills | Status: AC

## 2024-01-16 NOTE — Discharge Instructions (Signed)
 Take the antibiotic as prescribed for the next 3 days. Keep your scheduled appointment with your doctor later this week. Your urine should be rechecked at that time.   Return to the emergency department with any new or concerning symptoms.

## 2024-01-16 NOTE — ED Provider Notes (Signed)
 [place] EMERGENCY DEPARTMENT AT [facility] Provider Note   CSN: [id number] Arrival date & time: 01/16/24  1518     History  Chief Complaint  Patient presents with   Weakness    Christine Hood is a 58 y.o. female.  Patient to ED with symptoms that started a week ago described as generalized weakness, near syncope. Seven days ago she had symptoms while at a funeral and felt she got hot with symptoms resolving after a brief rest in air conditioning. Since then symptoms have been progressively worsening and more constant. She feels generally weak all the time, but when she goes to a standing position she feels like she is going to pass out and has to sit down until sxs subside. No full syncope. No ongoing or significant chest pain. No SOB, cough, recent fever. She does not experience nausea, vomiting. No dependent edema. She was seen at an ED last week when her blood pressure was found to be elevated without any history of same. Last blood pressure check prior to symptoms was 3 weeks ago at an employer mandated check. She was referred to her doctor for blood pressure recheck and went 4 days ago. She was started on Olmasartan at that time and has had 4 doses. Symptoms have not changed since starting this medication but she feels generally worse.   The history is provided by the patient. No language interpreter was used.  Weakness      Home Medications Prior to Admission medications   Medication Sig Start Date End Date Taking? Authorizing Provider  cephALEXin (KEFLEX) 500 MG capsule Take 1 capsule (500 mg total) by mouth 4 (four) times daily. 01/16/24  Yes Elpidio Anis, PA-C  acetaminophen (TYLENOL) 500 MG tablet Take 1,000 mg by mouth 2 (two) times daily as needed. For cramping      [provider]  cholestyramine (QUESTRAN) 4 G packet Take 1 packet by mouth daily before breakfast.      [provider]  diphenhydrAMINE (BENADRYL) 25 MG tablet Take 1 tablet  (25 mg total) by mouth every 6 (six) hours as needed. 04/06/21   Arby Barrette, MD  esomeprazole (NEXIUM) 40 MG capsule Take 1 capsule (40 mg total) by mouth daily. 06/28/22   Wallis Bamberg, PA-C  famotidine (PEPCID) 20 MG tablet Take 1 tablet (20 mg total) by mouth 2 (two) times daily. 06/28/22   Wallis Bamberg, PA-C  metoCLOPramide (REGLAN) 10 MG tablet You may take 1 tablet every 6 hours with 25 mg of Benadryl for headache. 04/06/21   Arby Barrette, MD  tranexamic acid (LYSTEDA) 650 MG TABS Take 1,300 mg by mouth 3 (three) times daily.      [provider]      Allergies    Sulfa antibiotics    Review of Systems   Review of Systems  Neurological:  Positive for weakness.    Physical Exam Updated Vital Signs BP (!) 148/82   Pulse 74   Temp 98.8 F (37.1 C)   Resp 12   Ht 5\' 2"  (1.575 m)   Wt 106.6 kg   SpO2 98%   BMI 42.98 kg/m  Physical Exam Vitals and nursing note reviewed.  Constitutional:      Appearance: She is well-developed.  HENT:     Head: Normocephalic.  Neck:     Vascular: No carotid bruit.  Cardiovascular:     Rate and Rhythm: Normal rate and regular rhythm.     Heart sounds: No murmur  heard. Pulmonary:     Effort: Pulmonary effort is normal.     Breath sounds: Normal breath sounds. No wheezing, rhonchi or rales.  Chest:     Chest wall: No tenderness.  Abdominal:     General: Bowel sounds are normal.     Palpations: Abdomen is soft.     Tenderness: There is no abdominal tenderness. There is no guarding or rebound.  Musculoskeletal:        General: Normal range of motion.     Cervical back: Normal range of motion and neck supple.     Right lower leg: No edema.     Left lower leg: No edema.  Skin:    General: Skin is warm and dry.  Neurological:     General: No focal deficit present.     Mental Status: She is alert and oriented to person, place, and time.     ED Results / Procedures / Treatments   Labs (all labs ordered are listed, but only  abnormal results are displayed) Labs Reviewed  CBC WITH DIFFERENTIAL/PLATELET - Abnormal; Notable for the following components:      Result Value   RBC 5.52 (*)    Hemoglobin 16.2 (*)    HCT 48.7 (*)    All other components within normal limits  COMPREHENSIVE METABOLIC PANEL - Abnormal; Notable for the following components:   Glucose, Bld 146 (*)    AST 14 (*)    All other components within normal limits  URINALYSIS, ROUTINE W REFLEX MICROSCOPIC - Abnormal; Notable for the following components:   Glucose, UA >1,000 (*)    Nitrite POSITIVE (*)    Bacteria, UA FEW (*)    All other components within normal limits   Results for orders placed or performed during the hospital encounter of 01/16/24  CBC with Differential   Collection Time: 01/16/24  5:00 PM  Result Value Ref Range   WBC 8.5 4.0 - 10.5 K/uL   RBC 5.52 (H) 3.87 - 5.11 MIL/uL   Hemoglobin 16.2 (H) 12.0 - 15.0 g/dL   HCT 16.1 (H) 09.6 - 04.5 %   MCV 88.2 80.0 - 100.0 fL   MCH 29.3 26.0 - 34.0 pg   MCHC 33.3 30.0 - 36.0 g/dL   RDW 40.9 81.1 - 91.4 %   Platelets 245 150 - 400 K/uL   nRBC 0.0 0.0 - 0.2 %   Neutrophils Relative % 67 %   Neutro Abs 5.7 1.7 - 7.7 K/uL   Lymphocytes Relative 22 %   Lymphs Abs 1.9 0.7 - 4.0 K/uL   Monocytes Relative 6 %   Monocytes Absolute 0.5 0.1 - 1.0 K/uL   Eosinophils Relative 4 %   Eosinophils Absolute 0.3 0.0 - 0.5 K/uL   Basophils Relative 1 %   Basophils Absolute 0.0 0.0 - 0.1 K/uL   Immature Granulocytes 0 %   Abs Immature Granulocytes 0.02 0.00 - 0.07 K/uL  Comprehensive metabolic panel   Collection Time: 01/16/24  5:00 PM  Result Value Ref Range   Sodium 141 135 - 145 mmol/L   Potassium 3.5 3.5 - 5.1 mmol/L   Chloride 102 98 - 111 mmol/L   CO2 28 22 - 32 mmol/L   Glucose, Bld 146 (H) 70 - 99 mg/dL   BUN 9 6 - 20 mg/dL   Creatinine, Ser 7.82 0.44 - 1.00 mg/dL   Calcium 9.0 8.9 - 95.6 mg/dL   Total Protein 7.0 6.5 - 8.1 g/dL   Albumin 4.2  3.5 - 5.0 g/dL   AST 14 (L) 15  - 41 U/L   ALT 17 0 - 44 U/L   Alkaline Phosphatase 78 38 - 126 U/L   Total Bilirubin 0.5 0.0 - 1.2 mg/dL   GFR, Estimated >65 >78 mL/min   Anion gap 11 5 - 15  Urinalysis, Routine w reflex microscopic -Urine, Clean Catch   Collection Time: 01/16/24  5:00 PM  Result Value Ref Range   Color, Urine YELLOW YELLOW   APPearance CLEAR CLEAR   Specific Gravity, Urine 1.026 1.005 - 1.030   pH 5.5 5.0 - 8.0   Glucose, UA >1,000 (A) NEGATIVE mg/dL   Hgb urine dipstick NEGATIVE NEGATIVE   Bilirubin Urine NEGATIVE NEGATIVE   Ketones, ur NEGATIVE NEGATIVE mg/dL   Protein, ur NEGATIVE NEGATIVE mg/dL   Nitrite POSITIVE (A) NEGATIVE   Leukocytes,Ua NEGATIVE NEGATIVE   RBC / HPF 0-5 0 - 5 RBC/hpf   WBC, UA 6-10 0 - 5 WBC/hpf   Bacteria, UA FEW (A) NONE SEEN   Squamous Epithelial / HPF 0-5 0 - 5 /HPF    EKG EKG Interpretation Date/Time:  Monday January 16 2024 16:39:58 EDT Ventricular Rate:  78 PR Interval:  140 QRS Duration:  82 QT Interval:  322 QTC Calculation: 367 R Axis:   -56  Text Interpretation: Normal sinus rhythm Left axis deviation Low voltage QRS Cannot rule out Anterior infarct , age undetermined Abnormal ECG When compared with ECG of 11-Sep-2012 17:44, Incomplete right bundle branch block is no longer Present Minimal criteria for Anterior infarct are now Present Confirmed by Vonita Moss [phone]) on 01/16/2024 5:49:00 PM  Radiology No results found.  Procedures Procedures    Medications Ordered in ED Medications  cephALEXin (KEFLEX) capsule 500 mg (has no administration in time range)  sodium chloride 0.9 % bolus 1,000 mL (1,000 mLs Intravenous New Bag/Given 01/16/24 1702)    ED Course/ Medical Decision Making/ A&P                                 Medical Decision Making This patient presents to the ED for concern of weakness, this involves an extensive number of treatment options, and is a complaint that carries with it a high risk of complications and morbidity.   The differential diagnosis includes sepsis/infection, hypertensive urgency, renal decline, viral process   Co morbidities that complicate the patient evaluation  No significant medical history   Additional history obtained:  Additional history and/or information obtained from chart review, notable for limited access to previous/recent medical evaluations   Lab Tests:  I Ordered, and personally interpreted labs.  The pertinent results include:  No leukocytosis, normal hgb; urine nitrite positive, >1000 glucose (blood glucose 146); no electrolyte abnormalities, normal renal function.    Imaging Studies ordered:  I ordered imaging studies including n/a I independently visualized and interpreted imaging which showed n/a I agree with the radiologist interpretation   Cardiac Monitoring:  The patient was maintained on a cardiac monitor.  I personally viewed and interpreted the cardiac monitored which showed an underlying rhythm of: NSR, left axis deviation   Medicines ordered and prescription drug management:  I ordered medication including IV fluids (1 liter)  for symptomatic relief Reevaluation of the patient after these medicines showed that the patient improved I have reviewed the patients home medicines and have made adjustments as needed   Test Considered:  N/a   Critical  Interventions:  N/a   Consultations Obtained:  I requested consultation with the n/a,  and discussed lab and imaging findings as well as pertinent plan - they recommend: n/a   Problem List / ED Course:  Persistent, worsening generalized weakness and symptoms of near syncope x 1 week.  Appears well, VSS Labs reviewed and are reassuring. Nitrite positive urine - patient reports frequency - will start abx for 3-day therapy She has a scheduled PCP appointment later this week. Will have urine rechecked at that time.    Reevaluation:  After the interventions noted above, I reevaluated the  patient and found that they have :improved   Social Determinants of Health:  Lives with husband   Disposition:  After consideration of the diagnostic results and the patients response to treatment, I feel that the patient would benefit from discharge home. Continue outpatient work up of persistent symptoms. .   Amount and/or Complexity of Data Reviewed Labs: ordered.  Risk Prescription drug management.           Final Clinical Impression(s) / ED Diagnoses Final diagnoses:  Weakness  Acute cystitis without hematuria    Rx / DC Orders ED Discharge Orders          Ordered    cephALEXin (KEFLEX) 500 MG capsule  4 times daily        01/16/24 1800              Elpidio Anis, PA-C 01/16/24 1806    Rondel Baton, MD 01/17/24 1455

## 2024-01-16 NOTE — ED Triage Notes (Signed)
 Seen at [facility] last week for HTN  placed on a med  f/u with her PCP now feels week and  no energy , no n/v/sob  just weak got tested for all resp at the hospital  she was neg for all

## 2024-04-16 ENCOUNTER — Encounter: Payer: Self-pay | Admitting: Cardiology

## 2024-04-16 ENCOUNTER — Ambulatory Visit: Attending: Cardiology | Admitting: Cardiology

## 2024-04-16 VITALS — BP 116/68 | HR 90 | Ht 62.0 in | Wt 219.6 lb

## 2024-04-16 DIAGNOSIS — R9431 Abnormal electrocardiogram [ECG] [EKG]: Secondary | ICD-10-CM | POA: Diagnosis not present

## 2024-04-16 DIAGNOSIS — Z7689 Persons encountering health services in other specified circumstances: Secondary | ICD-10-CM

## 2024-04-16 DIAGNOSIS — R55 Syncope and collapse: Secondary | ICD-10-CM

## 2024-04-16 NOTE — Patient Instructions (Addendum)
 Medication Instructions:  No changes  *If you need a refill on your cardiac medications before your next appointment, please call your pharmacy*   Lab Work: Not needed    Testing/Procedures: Your physician has requested that you have an echocardiogram. Echocardiography is a painless test that uses sound waves to create images of your heart. It provides your doctor with information about the size and shape of your heart and how well your heart's chambers and valves are working. This procedure takes approximately one hour. There are no restrictions for this procedure. Please do NOT wear cologne, perfume, aftershave, or lotions (deodorant is allowed). Please arrive 15 minutes prior to your appointment time.  Please note: We ask at that you not bring children with you during ultrasound (echo/ vascular) testing. Due to room size and safety concerns, children are not allowed in the ultrasound rooms during exams. Our front office staff cannot provide observation of children in our lobby area while testing is being conducted. An adult accompanying a patient to their appointment will only be allowed in the ultrasound room at the discretion of the ultrasound technician under special circumstances. We apologize for any inconvenience.    Follow-Up: At [facility], you and your health needs are our priority.  As part of our continuing mission to provide you with exceptional heart care, we have created designated Provider Care Teams.  These Care Teams include your primary Cardiologist (physician) and Advanced Practice Providers (APPs -  Physician Assistants and Nurse Practitioners) who all work together to provide you with the care you need, when you need it.     Your next appointment:   2 month(s) ( if normal appt will be on as needed basis) (After echo)  The format for your next appointment:   In Person  Provider:   Dr Alm Clay

## 2024-04-16 NOTE — Progress Notes (Unsigned)
 Cardiology Office Note:  .   Date:  04/17/2024  ID:  Christine Hood, DOB 1966-04-27, MRN [id number] PCP: Duwaine Burnard Amble, NP  [facility] Providers Cardiologist:  Alm Clay, MD     Chief Complaint  Patient presents with   New Patient (Initial Visit)    Abnormal EKG, near syncopal event    Patient Profile: .     Christine Hood is a morbidly obese, perimenopausal 58 y.o. female with a PMH notable for gallbladder disease-s/p lap chole who presents here for cardiology evaluation of a near syncopal episode and abnormal EKG at the request of Chodri, Tanvir, MD.  H/o Lap Chole & C-Sections; distant history of panic attacks     Christine Hood was seen on 2 occasions in the emergency room on March 20th and then March 24th. - March 20: Presented with accelerated hypertension-blood pressure was 156/99.  Noted flushing and weakness for about a week.  Because of elevated blood pressure she was referred to the ER and taken by ambulance.  She felt better after IV hydration  Subjective  Discussed the use of AI scribe software for clinical note transcription with the patient, who gave verbal consent to proceed.  History of Present Illness History of Present Illness Christine Hood is a 58 year old female who presents with an abnormal EKG and a history of a near syncopal episode. She was referred by her medical doctor due to EKG abnormalities and a recent episode of high blood pressure.  On March 14th, she experienced a near syncopal episode while attending a funeral, standing outside for over an hour in the heat. She felt overheated, saw spots, and felt she was going to pass out. After cooling off in a car, she felt fine. She was later diagnosed with a urinary tract infection (UTI) after visiting two hospitals.  Following the episode, she has not experienced any issues with blood pressure and is not currently on any blood pressure medication. Her blood pressure readings are lower than her  usual and have remained stable. She regularly checks her blood pressure, which remains stable.  She has a history of recurrent UTIs, occurring a few times a year, but did not realize she had one during the recent episode. She attributes the episode to dehydration from standing in the heat without drinking water.  She is currently taking Questran for her gallbladder removal and Lexapro for anxiety. She has a history of panic attacks, which have previously been mistaken for heart issues.  No chest pain, shortness of breath, heart palpitations, leg swelling, or recent illnesses other than the UTI. No recent episodes of passing out or feeling her heart racing without exertion.     Objective    Studies Reviewed: SABRA   EKG Interpretation Date/Time:  Monday April 16 2024 14:36:03 EDT Ventricular Rate:  90 PR Interval:  146 QRS Duration:  80 QT Interval:  400 QTC Calculation: 489 R Axis:   -61  Text Interpretation: Normal sinus rhythm Left axis deviation Incomplete right bundle branch block Inferior infarct , age undetermined T wave abnormality, consider anterior ischemia When compared with ECG of 16-Jan-2024 16:39, Inferior infarct is now Present Inverted T waves have replaced nonspecific T wave abnormality in Anterior leads QT has lengthened Confirmed by Clay Alm ([id number]) on 04/16/2024 2:47:55 PM      Risk Assessment/Calculations:               Physical Exam:   VS:  BP 116/68 (  BP Location: Left Arm, Patient Position: Sitting, Cuff Size: Normal)   Pulse 90   Ht 5' 2 (1.575 m)   Wt 219 lb 9.6 oz (99.6 kg)   SpO2 93%   BMI 40.17 kg/m    Wt Readings from Last 3 Encounters:  04/16/24 219 lb 9.6 oz (99.6 kg)  01/16/24 235 lb (106.6 kg)  04/06/21 252 lb (114.3 kg)    GEN: Well nourished, well groomed in no acute distress; morbidly obese but otherwise healthy-appearing NECK: No JVD; No carotid bruits CARDIAC: Normal S1, S2; RRR, no murmurs, rubs, gallops RESPIRATORY:  Clear to  auscultation without rales, wheezing or rhonchi ; nonlabored, good air movement. ABDOMEN: Soft, non-tender, non-distended EXTREMITIES:  No edema; No deformity      ASSESSMENT AND PLAN: .    Problem List Items Addressed This Visit       Cardiology Problems   Near syncope   Unusual episode after being outside for an hour in the heat.  Felt little lightheaded and dizzy.  Felt better after cooling off in the car and hydrating some.  Actually found to have a UTI. Differential includes dehydration related to heat as well as UTI, and anxiety. - Discussed importance for adequate hydration, especially if outside in the heat. - Order echocardiogram to assess cardiac function based on abnormal EKG.        Other   Nonspecific abnormal electrocardiogram (ECG) (EKG) - Primary (Chronic)   EKG abnormalities suggestive of prior myocardial infarction, possibly due to artifact or variation.  No current cardiac symptoms. Previous blood work showed no myocardial damage.   - Order echocardiogram to assess cardiac function and rule out prior myocardial infarction. - Review echocardiogram results to determine need for further cardiology evaluation.      Relevant Orders   EKG 12-Lead (Completed)   ECHOCARDIOGRAM COMPLETE           Follow-Up: Return in about 2 months (around 06/16/2024) for Followup if necessary pending results, To discuss test results.  If echo is abnormal      Signed, Alm MICAEL Clay, MD, MS Alm Clay, M.D., M.S. Interventional Chartered certified accountant  Pager # [phone]

## 2024-04-17 ENCOUNTER — Encounter: Payer: Self-pay | Admitting: Cardiology

## 2024-04-17 DIAGNOSIS — R55 Syncope and collapse: Secondary | ICD-10-CM | POA: Insufficient documentation

## 2024-04-17 DIAGNOSIS — R9431 Abnormal electrocardiogram [ECG] [EKG]: Secondary | ICD-10-CM | POA: Insufficient documentation

## 2024-04-17 NOTE — Assessment & Plan Note (Signed)
 EKG abnormalities suggestive of prior myocardial infarction, possibly due to artifact or variation.  No current cardiac symptoms. Previous blood work showed no myocardial damage.   - Order echocardiogram to assess cardiac function and rule out prior myocardial infarction. - Review echocardiogram results to determine need for further cardiology evaluation.

## 2024-04-17 NOTE — Assessment & Plan Note (Addendum)
 Unusual episode after being outside for an hour in the heat.  Felt little lightheaded and dizzy.  Felt better after cooling off in the car and hydrating some.  Actually found to have a UTI. Differential includes dehydration related to heat as well as UTI, and anxiety. - Discussed importance for adequate hydration, especially if outside in the heat. - Order echocardiogram to assess cardiac function based on abnormal EKG.

## 2024-05-30 ENCOUNTER — Ambulatory Visit (HOSPITAL_COMMUNITY)
Admission: RE | Admit: 2024-05-30 | Discharge: 2024-05-30 | Disposition: A | Discharge status: Home / Self Care | Source: Ambulatory Visit | Attending: Cardiovascular Disease | Admitting: Cardiovascular Disease

## 2024-05-30 DIAGNOSIS — R9431 Abnormal electrocardiogram [ECG] [EKG]: Secondary | ICD-10-CM | POA: Diagnosis present

## 2024-05-30 DIAGNOSIS — R55 Syncope and collapse: Secondary | ICD-10-CM | POA: Diagnosis not present

## 2024-05-30 HISTORY — PX: TRANSTHORACIC ECHOCARDIOGRAM: SHX275

## 2024-05-30 LAB — ECHOCARDIOGRAM COMPLETE
Area-P 1/2: 4.54 cm2
S' Lateral: 3 cm

## 2024-05-31 ENCOUNTER — Ambulatory Visit: Payer: Self-pay | Admitting: Cardiology

## 2024-06-01 NOTE — Telephone Encounter (Signed)
-----   Message from Alm Clay sent at 05/31/2024 10:51 PM EDT ----- Echocardiogram results shows normal pump function with an ejection fraction of 60 to 65% (normal ranges 50 to 70% There do not appear to be any wall motion abnormalities to suggest prior injury.  The heart appears to be relaxing well. Valves appear to be normal.  Pressures appear to be normal. OVERALL NORMAL STUDY   Great news.  No structural abnormality to explain passing out.  Alm Clay, MD  .  ----- Message ----- From: Interface, [employer] Sent: 05/30/2024  12:04 PM EDT To: Alm LELON Clay, MD

## 2024-06-01 NOTE — Telephone Encounter (Signed)
 Called -unable to leave message voicemail full  patient has appt 06/18/24

## 2024-06-18 ENCOUNTER — Ambulatory Visit: Attending: Cardiology | Admitting: Cardiology

## 2024-06-18 ENCOUNTER — Encounter: Payer: Self-pay | Admitting: Cardiology

## 2024-06-18 VITALS — BP 130/82 | HR 86 | Ht 62.0 in | Wt 223.0 lb

## 2024-06-18 DIAGNOSIS — R9431 Abnormal electrocardiogram [ECG] [EKG]: Secondary | ICD-10-CM | POA: Diagnosis not present

## 2024-06-18 DIAGNOSIS — E1169 Type 2 diabetes mellitus with other specified complication: Secondary | ICD-10-CM

## 2024-06-18 DIAGNOSIS — R55 Syncope and collapse: Secondary | ICD-10-CM | POA: Diagnosis not present

## 2024-06-18 NOTE — Progress Notes (Unsigned)
 Cardiology Office Note:  .   Date:  06/19/2024  ID:  AMYAH CLAWSON, DOB 1966/03/18, MRN [id number] PCP: Duwaine Burnard Amble, NP  [facility] Providers Cardiologist:  Alm Clay, MD     Chief Complaint  Patient presents with   Follow-up    Test results    Patient Profile: .     Christine Hood is a morbidly obese, perimenopausal  58 y.o. female  with a PMH notable for Recent Cholecystectomy & h/o Syncope who presents here for f/u evaluation to discuss test results.  Christine Hood was initially referred for cardiology evaluation of a near syncopal episode and abnormal EKG at the request of Chodri, Tanvir, MD.  H/o Lap Chole & C-Sections; distant history of panic attacks      Christine Hood was last seen on 04/16/24 for cardiology consult after an episode of really near syncope back in March.  She usually drinks plenty of water, but on this occasion was out at a funeral where she was standing outside for over an hour in high heat and wearing dress close.  She felt hot and flushed overheated feet.  She started feeling sweaty and flushed.  She was seeing spots across her eyes and she felt as though she may pass out.  She went in the car to cool down and started feeling better.  She was also then later treated with antibiotics for a UTI after 2 hospital visits.  She had not any further episodes.  Referred to cardiology because of the syncopal episode but also because of concerns for incomplete right bundle branch block and T wave O'Malley on EKG.: Echo Ordered.  She now presents for results.  Subjective  Discussed the use of AI scribe software for clinical note transcription with the patient, who gave verbal consent to proceed.  History of Present Illness Christine Hood is a morbidly obese 58 year old female who presents for follow-up after an episode of syncope and abnormal EKG findings.  Syncope and associated symptoms - Experienced a syncope episode on March 20th during a funeral after  prolonged sun exposure and inadequate hydration - Described the episode as 'a very scary feeling' with visual changes (seeing spots) prior to loss of consciousness - No further episodes of syncope since March 20th - No palpitations, heart racing, skipping, or flip-flopping sensations since the initial episode  Hydration status and preventive measures - Maintains hydration by drinking a significant amount of water daily - Exercises caution regarding dehydration, especially with sun exposure  Type 2 diabetes mellitus and medication intolerance - Has type 2 diabetes mellitus - Experienced gastrointestinal side effects with metformin - Developed GERD symptoms with Rybelsus  Current and recent medications - Currently taking cholestyramine (Questran) and Lexapro - No recent use of Nexium , Pepcid , Reglan , or antibiotics - Uses Benadryl  as needed for allergies, but has not taken it in years    Objective   Medications: Cholesterol program daily, Benadryl  25 mg as needed, Lexapro 10 mg daily.  Studies Reviewed: SABRA        No new labs. Results Echocardiogram (05/30/2024): Normal LVEF 60-65%, no RWMA, normal valve function, normal pressures  Risk Assessment/Calculations:               Physical Exam:   VS:  BP 130/82 (BP Location: Right Arm, Patient Position: Sitting, Cuff Size: Normal)   Pulse 86   Ht 5' 2 (1.575 m)   Wt 223 lb (101.2 kg)   SpO2  96%   BMI 40.79 kg/m    Wt Readings from Last 3 Encounters:  06/18/24 223 lb (101.2 kg)  04/16/24 219 lb 9.6 oz (99.6 kg)  01/16/24 235 lb (106.6 kg)      GEN: Well nourished, well developed in no acute distress; morbidly obese NECK: No JVD; No carotid bruits CARDIAC: Distant heart sounds but normal S1, S2; RRR, no murmurs, rubs, gallops RESPIRATORY:  Clear to auscultation without rales, wheezing or rhonchi ; nonlabored, good air movement. ABDOMEN: Soft, non-tender, non-distended EXTREMITIES:  No edema; No deformity       ASSESSMENT AND PLAN: .    Problem List Items Addressed This Visit       Cardiology Problems   Near syncope   No further episodes of syncope since the original episode.  Most likely a vasovagal episode with dehydration and long standing in the heat.  Reviewed.  Potential component of heat exhaustion.  Discussed importance of adequate hydration which she does schedule both.  Advised on hydration and electrolyte solutions. Aware of hydration importance. - Advise use of zero-calorie electrolyte solutions during prolonged sun exposure or when feeling dehydrated.  Thankfully, her here EKG is essentially normal and her echocardiogram is normal.  There were no signs or symptoms of irregular heartbeats to suspect an arrhythmia. Where she did have another episode, I think we could probably consider a monitor and potentially carotid Dopplers, but given her young age, carotid Dopplers are not likely to be of any benefit.         Other   Nonspecific abnormal electrocardiogram (ECG) (EKG) - Primary (Chronic)   Electrocardiographic and echocardiographic findings - Previous EKG demonstrated incomplete right bundle branch block - Echocardiogram on August 6th showed normal cardiac function with ejection fraction of 60-65% and no regional wall motion abnormalities => likely benign findings.      Type 2 diabetes mellitus associated with morbid obesity (HCC)   Explored weight loss options. Experienced adverse effects with metformin and Rybelsus. Interested in GLP-1 receptor agonists. Discussed dehydration and UTI risks with these medications. Advised on hydration and electrolyte supplementation. - Discuss GLP-1 receptor agonist options with primary care provider. - Consider referral to [facility] for weight management and medication guidance.  Obesity Knee issues limit physical activity. Discussed weight loss medications, including GLP-1 receptor agonists, emphasizing hydration and  electrolyte balance. - Consider referral to [facility] for weight management support.               Follow-Up: Return if symptoms worsen or fail to improve, for Followup when necessary.     Signed, Alm MICAEL Clay, MD, MS Alm Clay, M.D., M.S. Interventional Chartered certified accountant  Pager # [phone]

## 2024-06-18 NOTE — Patient Instructions (Signed)
 Other Instructions    Discuss with primary about -- Ozempic or Moujourno   Keep Hydrate  especially if you will be outside May use Gatorade - sugar  free, [place] hydration     Medication Instructions:  Not needed   Lab Work:   Not needed   Testing/Procedures:    Follow-Up: At [facility], you and your health needs are our priority.  As part of our continuing mission to provide you with exceptional heart care, we have created designated Provider Care Teams.  These Care Teams include your primary Cardiologist (physician) and Advanced Practice Providers (APPs -  Physician Assistants and Nurse Practitioners) who all work together to provide you with the care you need, when you need it.  We recommend signing up for the patient portal called MyChart.  Sign up information is provided on this After Visit Summary.  MyChart is used to connect with patients for Virtual Visits (Telemedicine).  Patients are able to view lab/test results, encounter notes, upcoming appointments, etc.  Non-urgent messages can be sent to your provider as well.   To learn more about what you can do with MyChart, go to [email or web address].    Your next appointment:    As needed  The format for your next appointment:   In Person  Provider:   Alm Clay, MD   Other Instructions    Discuss with primary about -- Ozempic or Moujourno   Keep Hydrate  especially if you will be outside May use Gatorade - sugar  free, [place] hydration

## 2024-06-19 ENCOUNTER — Encounter: Payer: Self-pay | Admitting: Cardiology

## 2024-06-19 NOTE — Assessment & Plan Note (Signed)
 Explored weight loss options. Experienced adverse effects with metformin and Rybelsus. Interested in GLP-1 receptor agonists. Discussed dehydration and UTI risks with these medications. Advised on hydration and electrolyte supplementation. - Discuss GLP-1 receptor agonist options with primary care provider. - Consider referral to [facility] for weight management and medication guidance.  Obesity Knee issues limit physical activity. Discussed weight loss medications, including GLP-1 receptor agonists, emphasizing hydration and electrolyte balance. - Consider referral to [facility] for weight management support.

## 2024-06-19 NOTE — Assessment & Plan Note (Addendum)
 No further episodes of syncope since the original episode.  Most likely a vasovagal episode with dehydration and long standing in the heat.  Reviewed.  Potential component of heat exhaustion.  Discussed importance of adequate hydration which she does schedule both.  Advised on hydration and electrolyte solutions. Aware of hydration importance. - Advise use of zero-calorie electrolyte solutions during prolonged sun exposure or when feeling dehydrated.  Thankfully, her here EKG is essentially normal and her echocardiogram is normal.  There were no signs or symptoms of irregular heartbeats to suspect an arrhythmia. Where she did have another episode, I think we could probably consider a monitor and potentially carotid Dopplers, but given her young age, carotid Dopplers are not likely to be of any benefit.

## 2024-06-19 NOTE — Assessment & Plan Note (Signed)
 Electrocardiographic and echocardiographic findings - Previous EKG demonstrated incomplete right bundle branch block - Echocardiogram on August 6th showed normal cardiac function with ejection fraction of 60-65% and no regional wall motion abnormalities => likely benign findings.
